# Patient Record
Sex: Male | Born: 1970 | Race: White | Hispanic: No | Marital: Single | Smoking: Current every day smoker
Health system: Southern US, Community
[De-identification: ages and names within clinical notes are randomized; demographics above are authoritative.]

---

## 2015-09-17 ENCOUNTER — Emergency Department (HOSPITAL_BASED_OUTPATIENT_CLINIC_OR_DEPARTMENT_OTHER): Payer: Self-pay

## 2015-09-17 ENCOUNTER — Emergency Department (HOSPITAL_BASED_OUTPATIENT_CLINIC_OR_DEPARTMENT_OTHER)
Admission: EM | Admit: 2015-09-17 | Discharge: 2015-09-17 | Disposition: A | Discharge status: Home / Self Care | Payer: Self-pay | Attending: Emergency Medicine | Admitting: Emergency Medicine

## 2015-09-17 ENCOUNTER — Encounter (HOSPITAL_BASED_OUTPATIENT_CLINIC_OR_DEPARTMENT_OTHER): Payer: Self-pay | Admitting: *Deleted

## 2015-09-17 DIAGNOSIS — S92301A Fracture of unspecified metatarsal bone(s), right foot, initial encounter for closed fracture: Secondary | ICD-10-CM

## 2015-09-17 DIAGNOSIS — W231XXA Caught, crushed, jammed, or pinched between stationary objects, initial encounter: Secondary | ICD-10-CM | POA: Insufficient documentation

## 2015-09-17 DIAGNOSIS — Y998 Other external cause status: Secondary | ICD-10-CM | POA: Insufficient documentation

## 2015-09-17 DIAGNOSIS — Z720 Tobacco use: Secondary | ICD-10-CM | POA: Insufficient documentation

## 2015-09-17 DIAGNOSIS — Y9289 Other specified places as the place of occurrence of the external cause: Secondary | ICD-10-CM | POA: Insufficient documentation

## 2015-09-17 DIAGNOSIS — Y9389 Activity, other specified: Secondary | ICD-10-CM | POA: Insufficient documentation

## 2015-09-17 DIAGNOSIS — S92311A Displaced fracture of first metatarsal bone, right foot, initial encounter for closed fracture: Secondary | ICD-10-CM | POA: Insufficient documentation

## 2015-09-17 MED ORDER — IBUPROFEN 800 MG PO TABS: 800.0000 mg | ORAL_TABLET | Freq: Four times a day (QID) | ORAL | Status: AC | PRN

## 2015-09-17 MED ORDER — HYDROCODONE-ACETAMINOPHEN 5-325 MG PO TABS
2.0000 | ORAL_TABLET | Freq: Once | ORAL | Status: AC
Start: 2015-09-17 — End: 2015-09-17
  Administered 2015-09-17: 2 via ORAL
  Filled 2015-09-17: qty 2

## 2015-09-17 MED ORDER — IBUPROFEN 800 MG PO TABS: 800.0000 mg | ORAL_TABLET | Freq: Once | ORAL | Status: DC

## 2015-09-17 NOTE — ED Notes (Addendum)
Physician changed order to a post of shoe vs a cam walker. Patient has a house arrest bracelet on thus making a cam walker not an option.

## 2015-09-17 NOTE — ED Notes (Signed)
Right foot injury. A lift mashed his foot last night. Swelling, bruising and pain.

## 2015-09-17 NOTE — ED Provider Notes (Signed)
CSN: [id number]     Arrival date & time 09/17/15  1234 History   First MD Initiated Contact with Patient 09/17/15 1300     Chief Complaint  Patient presents with  . Foot Injury    HPI Comments: Patient presents after work injury last night around midnight. States that he was getting off the lift and the lift came down on right foot crushing it. He heard his bones cracking in his foot. Has had some associated bruising and swelling. No loss of sensation. Unable to bear weight. Severity of pain 10/10.   Patient is a 44 y.o. male presenting with foot injury.  Foot Injury Location:  Foot Injury: yes   Mechanism of injury: crush   Crush injury:    Mechanism:  Industrial machinery Foot location:  R foot Chronicity:  New Dislocation: no   Foreign body present:  No foreign bodies Prior injury to area:  No Relieved by:  None tried Worsened by:  Bearing weight Associated symptoms: decreased ROM and swelling     History reviewed. No pertinent past medical history. History reviewed. No pertinent past surgical history. No family history on file. Social History  Substance Use Topics  . Smoking status: Current Every Day Smoker -- 0.50 packs/day    Types: Cigarettes  . Smokeless tobacco: None  . Alcohol Use: No    Review of Systems  Musculoskeletal: Positive for gait problem.  Neurological: Negative for weakness.  All other systems reviewed and are negative. Also per HPI  Allergies  Review of patient's allergies indicates no known allergies.  Home Medications   Prior to Admission medications   Medication Sig Start Date End Date Taking? Authorizing Provider  ibuprofen (ADVIL,MOTRIN) 800 MG tablet Take 1 tablet (800 mg total) by mouth every 6 (six) hours as needed for moderate pain. 09/17/15   Pincus Large, DO   BP 110/79 mmHg  Pulse 70  Temp(Src) 97.5 F (36.4 C) (Oral)  Resp 16  Ht  (1.753 m)  Wt 150 lb (68.04 kg)  BMI 22.14 kg/m2  SpO2 100% Physical Exam   Constitutional: He appears well-developed and well-nourished. No distress.  HENT:  Head: Normocephalic and atraumatic.  Eyes: EOM are normal.  Cardiovascular: Normal rate, regular rhythm and intact distal pulses.   Pulmonary/Chest: Effort normal and breath sounds normal.  Musculoskeletal:       Right foot: There is tenderness, bony tenderness and swelling. There is no laceration.  House arrest monitor around R ankle  Neurological: He is alert.  Skin: Skin is warm, dry and intact.  Psychiatric: He has a normal mood and affect.    ED Course  Procedures (including critical care time) Labs Review Labs Reviewed - No data to display  Imaging Review Dg Foot Complete Right  09/17/2015   CLINICAL DATA:  Pt had a heavy piece of equipment fall on his foot last night,anterior pain  EXAM: RIGHT FOOT COMPLETE - 3+ VIEW  COMPARISON:  None.  FINDINGS: There is a minimally displaced fracture of the mid first metatarsal. Minimally displaced fracture of the second metatarsal at the junction of the mid and distal thirds. No significant angulation at the fracture sites. Significant anterior soft tissue swelling.  IMPRESSION: 1. Fracture of the first metatarsal. 2. Fracture of the second metatarsal.   Electronically Signed   By: Norva Pavlov M.D.   On: 09/17/2015 12:59   I have personally reviewed and evaluated these images and lab results as part of my medical decision-making.  EKG Interpretation None      MDM   Final diagnoses:  Metatarsal bone fracture, right, closed, initial encounter   Patient presents to ED after crush injury to right foot. Imaging positive for fractures of both the first and second metatarsal. Patient placed in post-op shoe and provided crutches. Instructions to be non-weightbearing at first and then increase activity as tolerated. Information given for sports medicine/ortho provider so that he can follow-up in the next 4-6 weeks. Ibuprofen prescribed for pain control.  Patient stable for discharge home. Return precautions and handout given.    Caryl Ada, DO 09/18/2015, 8:11 AM PGY-2, [facility]    Pincus Large, DO 09/18/15 1610  Arby Barrette, MD 09/18/15 [phone]

## 2015-09-17 NOTE — Discharge Instructions (Signed)
Non-weight bearing for the first week Keep foot elevated and on ice Ibuprofen given for pain control Follow-up in 4-6 weeks with ortho/sprts medicine (number in papers) Continue to wear cast until follow-up   Metatarsal Fracture, Undisplaced A metatarsal fracture is a break in the bone(s) of the foot. These are the bones of the foot that connect your toes to the bones of the ankle. DIAGNOSIS  The diagnoses of these fractures are usually made with X-rays. If there are problems in the forefoot and x-rays are normal a later bone scan will usually make the diagnosis.  TREATMENT AND HOME CARE INSTRUCTIONS  Treatment may or may not include a cast or walking shoe. When casts are needed the use is usually for short periods of time so as not to slow down healing with muscle wasting (atrophy).  Activities should be stopped until further advised by your caregiver.  Wear shoes with adequate shock absorbing capabilities and stiff soles.  Alternative exercise may be undertaken while waiting for healing. These may include bicycling and swimming, or as your caregiver suggests.  It is important to keep all follow-up visits or specialty referrals. The failure to keep these appointments could result in improper bone healing and chronic pain or disability.  Warning: Do not drive a car or operate a motor vehicle until your caregiver specifically tells you it is safe to do so. IF YOU DO NOT HAVE A CAST OR SPLINT:  You may walk on your injured foot as tolerated or advised.  Do not put any weight on your injured foot for as long as directed by your caregiver. Slowly increase the amount of time you walk on the foot as the pain allows or as advised.  Use crutches until you can bear weight without pain. A gradual increase in weight bearing may help.  Apply ice to the injury for 15-20 minutes each hour while awake for the first 2 days. Put the ice in a plastic bag and place a towel between the bag of ice and  your skin.  Only take over-the-counter or prescription medicines for pain, discomfort, or fever as directed by your caregiver. SEEK IMMEDIATE MEDICAL CARE IF:   Your cast gets damaged or breaks.  You have continued severe pain or more swelling than you did before the cast was put on, or the pain is not controlled with medications.  Your skin or nails below the injury turn blue or grey, or feel cold or numb.  There is a bad smell, or new stains or pus-like (purulent) drainage coming from the cast. MAKE SURE YOU:   Understand these instructions.  Will watch your condition.  Will get help right away if you are not doing well or get worse. Document Released: 08/29/2002 Document Revised: 02/29/2012 Document Reviewed: 07/20/2008 [facility] Patient Information 2015 [place], [place]. This information is not intended to replace advice given to you by your health care provider. Make sure you discuss any questions you have with your health care provider.

## 2017-01-05 IMAGING — DX DG FOOT COMPLETE 3+V*R*
3 series · 3 of 3 positions shown · non-contrast
Comparison: None.

CLINICAL DATA: Pt had a heavy piece of equipment fall on his foot
last night,anterior pain

EXAM:
RIGHT FOOT COMPLETE - 3+ VIEW

[foot ap]
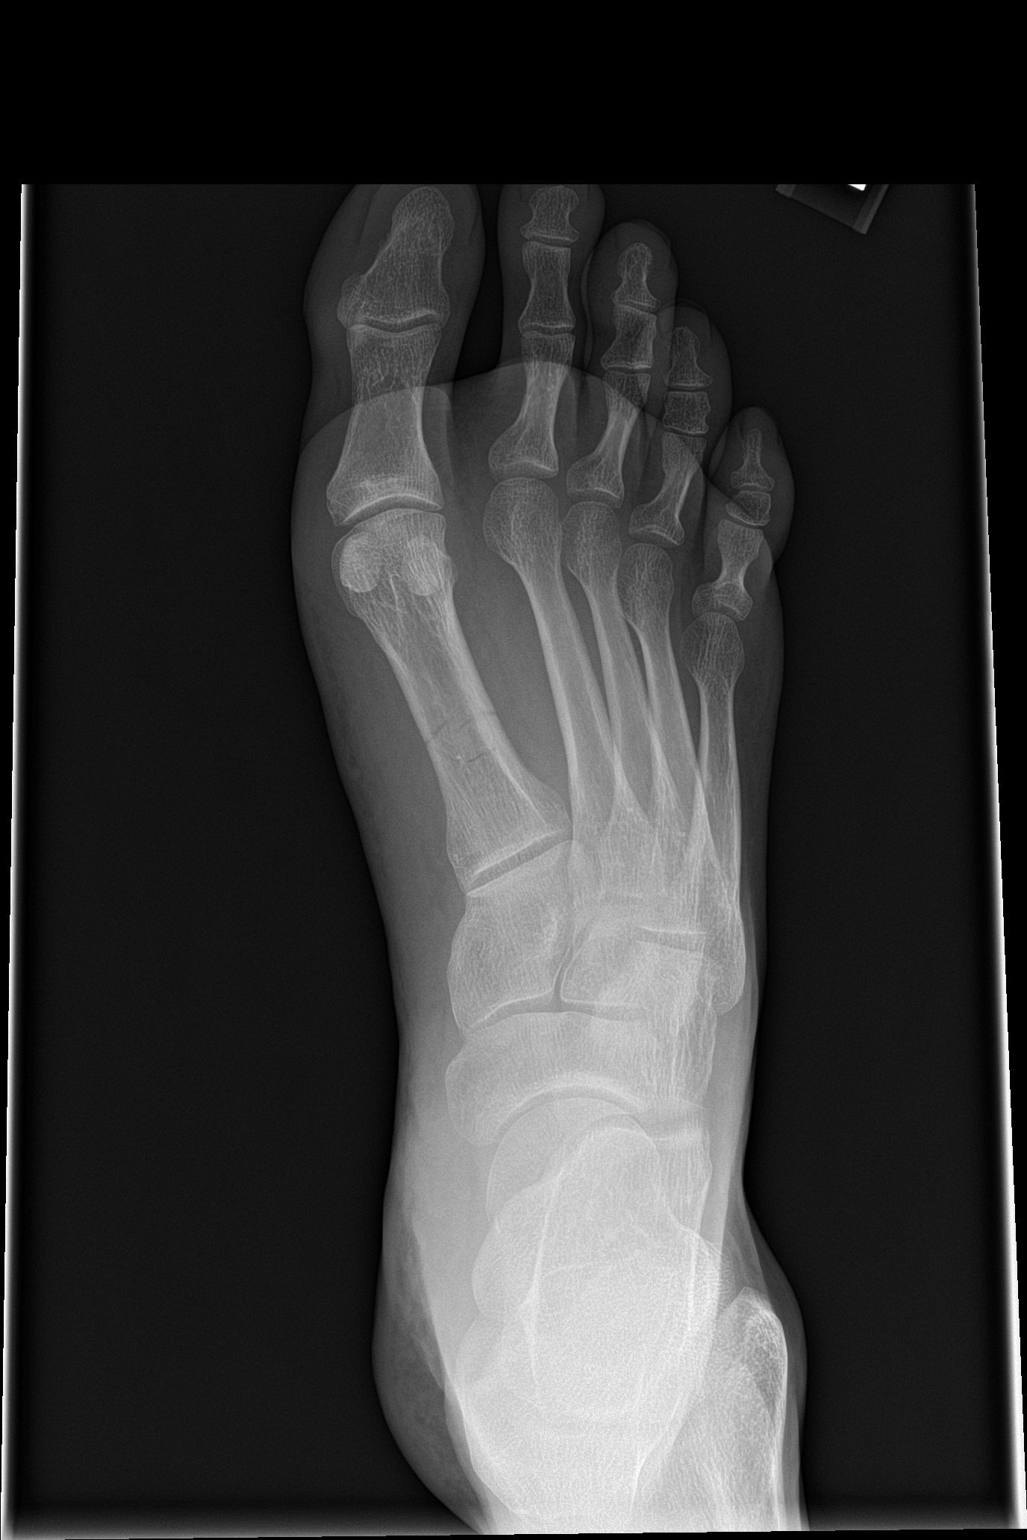

[foot obl]
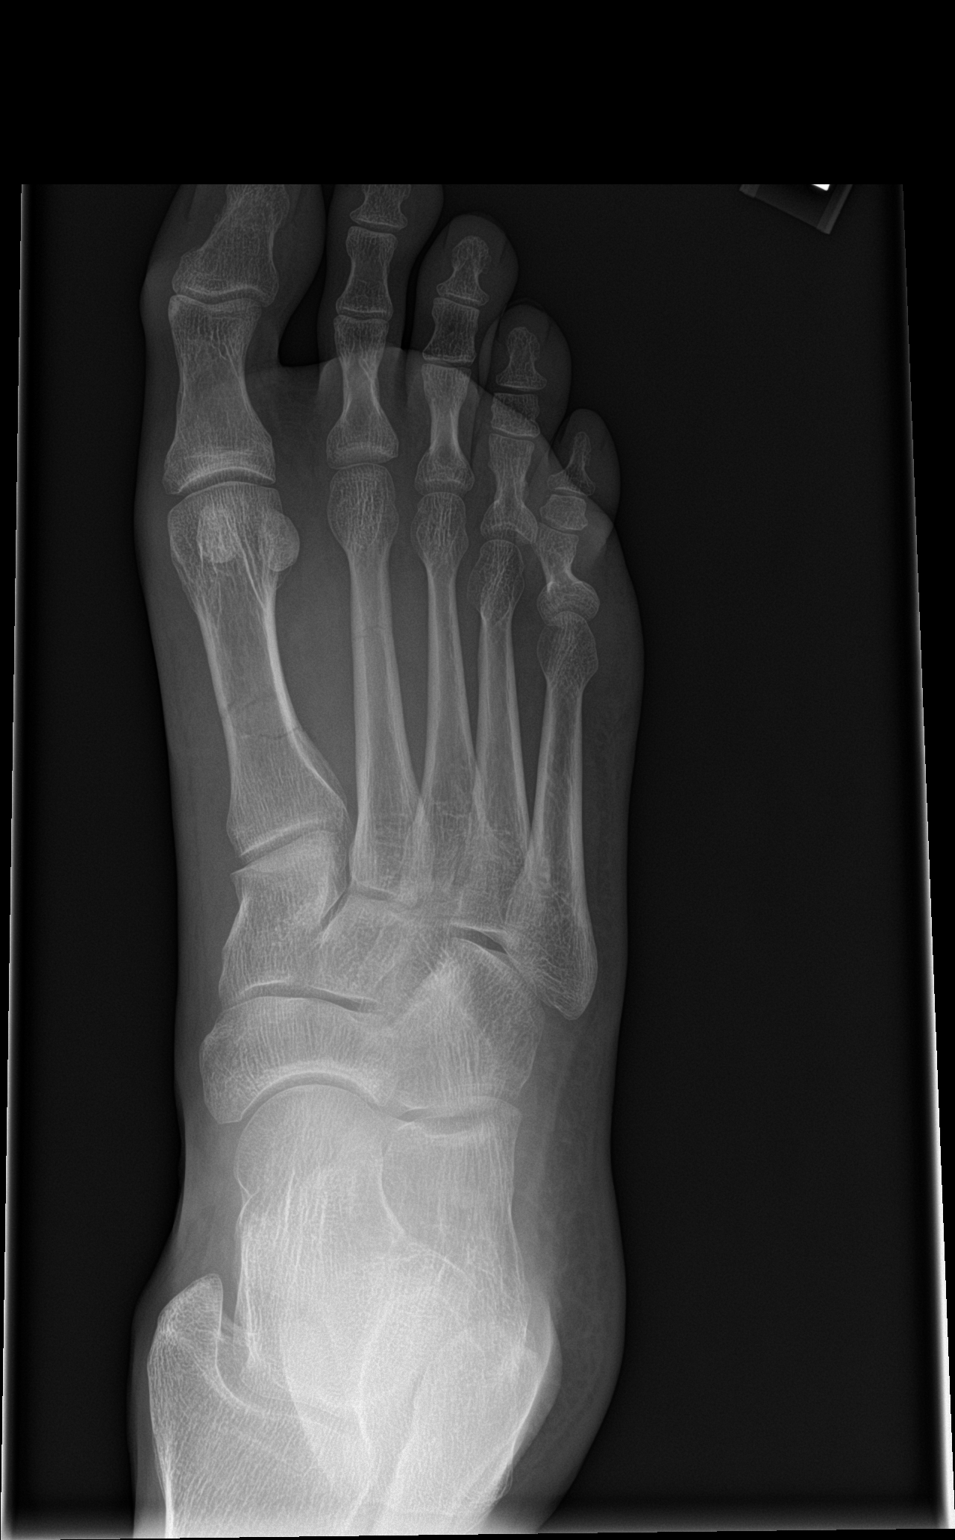

[foot lat]
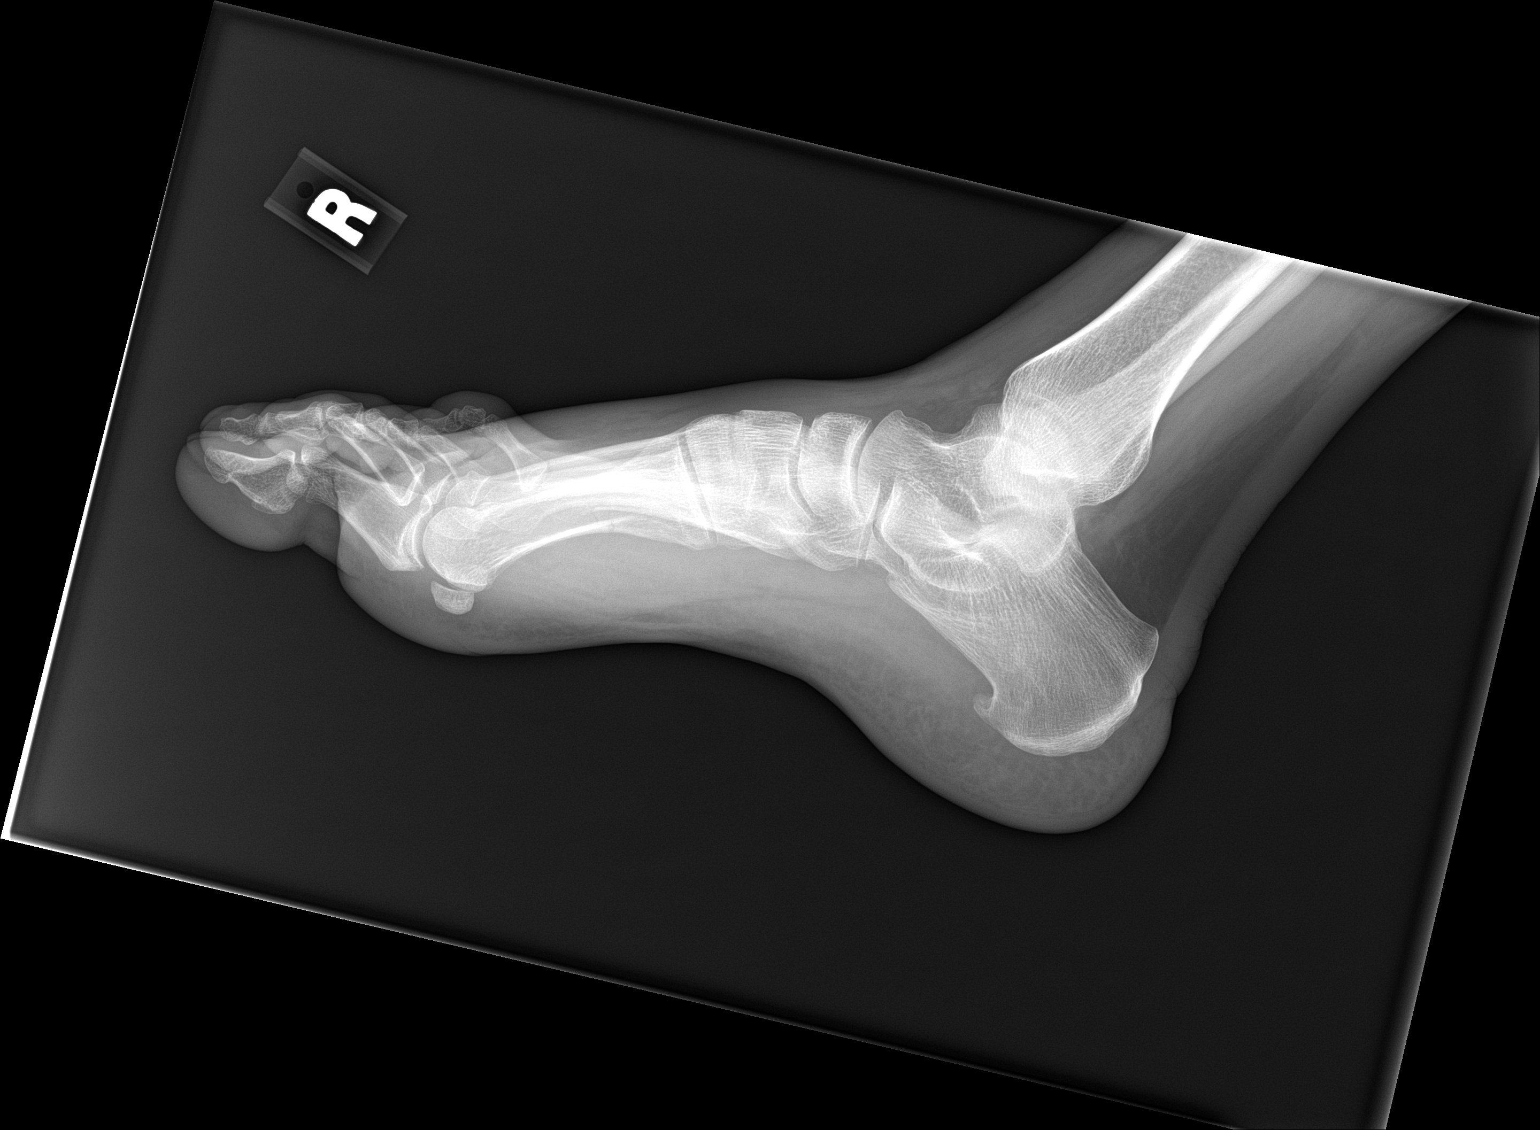

[3 of 3 positions shown; findings below may reference images not displayed]

FINDINGS: There is a minimally displaced fracture of the mid first metatarsal.
Minimally displaced fracture of the second metatarsal at the
junction of the mid and distal thirds. No significant angulation at
the fracture sites. Significant anterior soft tissue swelling.
IMPRESSION: 1. Fracture of the first metatarsal.
2. Fracture of the second metatarsal.
